# Patient Record
Sex: Male | Born: 1950 | Race: White | Hispanic: No | Marital: Married
Health system: Southern US, Community
[De-identification: ages and names within clinical notes are randomized; demographics above are authoritative.]

---

## 2007-04-08 ENCOUNTER — Ambulatory Visit: Payer: Self-pay | Admitting: Gastroenterology

## 2007-04-28 ENCOUNTER — Ambulatory Visit: Payer: Self-pay | Admitting: General Surgery

## 2008-07-12 ENCOUNTER — Ambulatory Visit: Payer: Self-pay | Admitting: Gastroenterology

## 2010-06-06 ENCOUNTER — Ambulatory Visit: Payer: Self-pay | Admitting: Gastroenterology

## 2012-08-22 ENCOUNTER — Ambulatory Visit: Payer: Self-pay | Admitting: Gastroenterology

## 2013-09-26 ENCOUNTER — Ambulatory Visit: Payer: Self-pay | Admitting: Internal Medicine

## 2013-10-02 ENCOUNTER — Ambulatory Visit: Payer: Self-pay | Admitting: Gastroenterology

## 2013-10-04 ENCOUNTER — Other Ambulatory Visit: Payer: Self-pay | Admitting: Gastroenterology

## 2013-10-04 ENCOUNTER — Ambulatory Visit: Payer: Self-pay | Admitting: Gastroenterology

## 2013-10-04 LAB — CLOSTRIDIUM DIFFICILE(ARMC)

## 2013-10-04 LAB — PLATELET COUNT: Platelet: 254 10*3/uL (ref 150–440)

## 2013-10-04 LAB — PROTIME-INR
INR: 1.1
PROTHROMBIN TIME: 14.1 s (ref 11.5–14.7)

## 2013-10-18 ENCOUNTER — Inpatient Hospital Stay: Payer: Self-pay | Admitting: Internal Medicine

## 2013-10-18 LAB — URINALYSIS, COMPLETE
BILIRUBIN, UR: NEGATIVE
BLOOD: NEGATIVE
Glucose,UR: >=500 mg/dL (ref 0–75)
KETONE: NEGATIVE
LEUKOCYTE ESTERASE: NEGATIVE
NITRITE: NEGATIVE
PROTEIN: NEGATIVE
Ph: 5 (ref 4.5–8.0)
Specific Gravity: 1.018 (ref 1.003–1.030)
Squamous Epithelial: NONE SEEN
WBC UR: 2 /HPF (ref 0–5)

## 2013-10-18 LAB — CBC
HCT: 43.7 % (ref 40.0–52.0)
HGB: 14.7 g/dL (ref 13.0–18.0)
MCH: 30.6 pg (ref 26.0–34.0)
MCHC: 33.7 g/dL (ref 32.0–36.0)
MCV: 91 fL (ref 80–100)
Platelet: 201 10*3/uL (ref 150–440)
RBC: 4.81 10*6/uL (ref 4.40–5.90)
RDW: 15.3 % — AB (ref 11.5–14.5)
WBC: 14.5 10*3/uL — AB (ref 3.8–10.6)

## 2013-10-18 LAB — COMPREHENSIVE METABOLIC PANEL
ALK PHOS: 182 U/L — AB
ALT: 118 U/L — AB (ref 12–78)
Albumin: 3.4 g/dL (ref 3.4–5.0)
Anion Gap: 10 (ref 7–16)
BILIRUBIN TOTAL: 6 mg/dL — AB (ref 0.2–1.0)
BUN: 18 mg/dL (ref 7–18)
Calcium, Total: 9.3 mg/dL (ref 8.5–10.1)
Chloride: 106 mmol/L (ref 98–107)
Co2: 21 mmol/L (ref 21–32)
Creatinine: 0.87 mg/dL (ref 0.60–1.30)
EGFR (African American): >60
EGFR (Non-African Amer.): >60
GLUCOSE: 237 mg/dL — AB (ref 65–99)
Osmolality: 283 (ref 275–301)
POTASSIUM: 3.5 mmol/L (ref 3.5–5.1)
SGOT(AST): 114 U/L — ABNORMAL HIGH (ref 15–37)
SODIUM: 137 mmol/L (ref 136–145)
Total Protein: 7.9 g/dL (ref 6.4–8.2)

## 2013-10-18 LAB — LIPASE, BLOOD: LIPASE: 164 U/L (ref 73–393)

## 2013-10-18 LAB — TROPONIN I: Troponin-I: <0.02 ng/mL

## 2013-10-19 LAB — COMPREHENSIVE METABOLIC PANEL
ALT: 95 U/L — AB (ref 12–78)
Albumin: 2.6 g/dL — ABNORMAL LOW (ref 3.4–5.0)
Alkaline Phosphatase: 108 U/L
Anion Gap: 6 — ABNORMAL LOW (ref 7–16)
BUN: 14 mg/dL (ref 7–18)
Bilirubin,Total: 7.2 mg/dL — ABNORMAL HIGH (ref 0.2–1.0)
CO2: 26 mmol/L (ref 21–32)
CREATININE: 1.01 mg/dL (ref 0.60–1.30)
Calcium, Total: 8 mg/dL — ABNORMAL LOW (ref 8.5–10.1)
Chloride: 104 mmol/L (ref 98–107)
EGFR (African American): >60
Glucose: 181 mg/dL — ABNORMAL HIGH (ref 65–99)
Osmolality: 277 (ref 275–301)
Potassium: 3.7 mmol/L (ref 3.5–5.1)
SGOT(AST): 87 U/L — ABNORMAL HIGH (ref 15–37)
Sodium: 136 mmol/L (ref 136–145)
Total Protein: 6.2 g/dL — ABNORMAL LOW (ref 6.4–8.2)

## 2013-10-19 LAB — CBC WITH DIFFERENTIAL/PLATELET
BASOS ABS: 0 10*3/uL (ref 0.0–0.1)
BASOS PCT: 0.3 %
Eosinophil #: 0 10*3/uL (ref 0.0–0.7)
Eosinophil %: 0.1 %
HCT: 36 % — ABNORMAL LOW (ref 40.0–52.0)
HGB: 12.3 g/dL — AB (ref 13.0–18.0)
LYMPHS ABS: 1.1 10*3/uL (ref 1.0–3.6)
LYMPHS PCT: 8.6 %
MCH: 31 pg (ref 26.0–34.0)
MCHC: 34.1 g/dL (ref 32.0–36.0)
MCV: 91 fL (ref 80–100)
Monocyte #: 1.1 x10 3/mm — ABNORMAL HIGH (ref 0.2–1.0)
Monocyte %: 8.8 %
NEUTROS PCT: 82.2 %
Neutrophil #: 10.2 10*3/uL — ABNORMAL HIGH (ref 1.4–6.5)
PLATELETS: 116 10*3/uL — AB (ref 150–440)
RBC: 3.96 10*6/uL — ABNORMAL LOW (ref 4.40–5.90)
RDW: 15.1 % — ABNORMAL HIGH (ref 11.5–14.5)
WBC: 12.3 10*3/uL — ABNORMAL HIGH (ref 3.8–10.6)

## 2013-10-20 LAB — COMPREHENSIVE METABOLIC PANEL
ALBUMIN: 2.5 g/dL — AB (ref 3.4–5.0)
ALT: 77 U/L (ref 12–78)
ANION GAP: 3 — AB (ref 7–16)
Alkaline Phosphatase: 111 U/L
BILIRUBIN TOTAL: 8.4 mg/dL — AB (ref 0.2–1.0)
BUN: 12 mg/dL (ref 7–18)
Calcium, Total: 8.8 mg/dL (ref 8.5–10.1)
Chloride: 108 mmol/L — ABNORMAL HIGH (ref 98–107)
Co2: 27 mmol/L (ref 21–32)
Creatinine: 0.94 mg/dL (ref 0.60–1.30)
EGFR (African American): >60
EGFR (Non-African Amer.): >60
Glucose: 149 mg/dL — ABNORMAL HIGH (ref 65–99)
Osmolality: 278 (ref 275–301)
Potassium: 4.3 mmol/L (ref 3.5–5.1)
SGOT(AST): 61 U/L — ABNORMAL HIGH (ref 15–37)
Sodium: 138 mmol/L (ref 136–145)
TOTAL PROTEIN: 6.3 g/dL — AB (ref 6.4–8.2)

## 2013-10-20 LAB — CBC WITH DIFFERENTIAL/PLATELET
BASOS ABS: 0 10*3/uL (ref 0.0–0.1)
BASOS PCT: 0.7 %
Eosinophil #: 0.1 10*3/uL (ref 0.0–0.7)
Eosinophil %: 1.5 %
HCT: 36.6 % — AB (ref 40.0–52.0)
HGB: 12.1 g/dL — AB (ref 13.0–18.0)
Lymphocyte #: 0.8 10*3/uL — ABNORMAL LOW (ref 1.0–3.6)
Lymphocyte %: 14.3 %
MCH: 30.4 pg (ref 26.0–34.0)
MCHC: 33.2 g/dL (ref 32.0–36.0)
MCV: 92 fL (ref 80–100)
MONO ABS: 0.4 x10 3/mm (ref 0.2–1.0)
Monocyte %: 6.4 %
Neutrophil #: 4.4 10*3/uL (ref 1.4–6.5)
Neutrophil %: 77.1 %
Platelet: 89 10*3/uL — ABNORMAL LOW (ref 150–440)
RBC: 4 10*6/uL — AB (ref 4.40–5.90)
RDW: 15.5 % — ABNORMAL HIGH (ref 11.5–14.5)
WBC: 5.7 10*3/uL (ref 3.8–10.6)

## 2013-10-20 LAB — URINE CULTURE

## 2013-10-21 LAB — COMPREHENSIVE METABOLIC PANEL
ALBUMIN: 2.5 g/dL — AB (ref 3.4–5.0)
ALK PHOS: 116 U/L
ALT: 66 U/L (ref 12–78)
AST: 51 U/L — AB (ref 15–37)
Anion Gap: 3 — ABNORMAL LOW (ref 7–16)
BUN: 11 mg/dL (ref 7–18)
Bilirubin,Total: 7 mg/dL — ABNORMAL HIGH (ref 0.2–1.0)
CHLORIDE: 108 mmol/L — AB (ref 98–107)
Calcium, Total: 8.4 mg/dL — ABNORMAL LOW (ref 8.5–10.1)
Co2: 25 mmol/L (ref 21–32)
Creatinine: 0.93 mg/dL (ref 0.60–1.30)
EGFR (Non-African Amer.): >60
GLUCOSE: 116 mg/dL — AB (ref 65–99)
OSMOLALITY: 272 (ref 275–301)
Potassium: 4.2 mmol/L (ref 3.5–5.1)
SODIUM: 136 mmol/L (ref 136–145)
Total Protein: 6.5 g/dL (ref 6.4–8.2)

## 2013-10-21 LAB — CBC WITH DIFFERENTIAL/PLATELET
BASOS ABS: 0 10*3/uL (ref 0.0–0.1)
Basophil %: 0.8 %
EOS ABS: 0.1 10*3/uL (ref 0.0–0.7)
Eosinophil %: 2 %
HCT: 35.6 % — ABNORMAL LOW (ref 40.0–52.0)
HGB: 12.1 g/dL — AB (ref 13.0–18.0)
Lymphocyte #: 1 10*3/uL (ref 1.0–3.6)
Lymphocyte %: 20.1 %
MCH: 30.7 pg (ref 26.0–34.0)
MCHC: 33.9 g/dL (ref 32.0–36.0)
MCV: 91 fL (ref 80–100)
Monocyte #: 0.4 x10 3/mm (ref 0.2–1.0)
Monocyte %: 8.9 %
Neutrophil #: 3.3 10*3/uL (ref 1.4–6.5)
Neutrophil %: 68.2 %
PLATELETS: 95 10*3/uL — AB (ref 150–440)
RBC: 3.93 10*6/uL — AB (ref 4.40–5.90)
RDW: 15.3 % — AB (ref 11.5–14.5)
WBC: 4.8 10*3/uL (ref 3.8–10.6)

## 2013-10-22 LAB — CBC WITH DIFFERENTIAL/PLATELET
Basophil #: 0 10*3/uL (ref 0.0–0.1)
Basophil %: 0.9 %
EOS ABS: 0.1 10*3/uL (ref 0.0–0.7)
Eosinophil %: 2.4 %
HCT: 37.8 % — AB (ref 40.0–52.0)
HGB: 12.9 g/dL — AB (ref 13.0–18.0)
LYMPHS ABS: 1.2 10*3/uL (ref 1.0–3.6)
Lymphocyte %: 22 %
MCH: 31 pg (ref 26.0–34.0)
MCHC: 34.2 g/dL (ref 32.0–36.0)
MCV: 91 fL (ref 80–100)
Monocyte #: 0.6 x10 3/mm (ref 0.2–1.0)
Monocyte %: 9.9 %
NEUTROS ABS: 3.6 10*3/uL (ref 1.4–6.5)
Neutrophil %: 64.8 %
Platelet: 114 10*3/uL — ABNORMAL LOW (ref 150–440)
RBC: 4.17 10*6/uL — ABNORMAL LOW (ref 4.40–5.90)
RDW: 15 % — AB (ref 11.5–14.5)
WBC: 5.6 10*3/uL (ref 3.8–10.6)

## 2013-10-22 LAB — COMPREHENSIVE METABOLIC PANEL
ALK PHOS: 131 U/L — AB
Albumin: 2.5 g/dL — ABNORMAL LOW (ref 3.4–5.0)
Anion Gap: 8 (ref 7–16)
BUN: 10 mg/dL (ref 7–18)
Bilirubin,Total: 7.1 mg/dL — ABNORMAL HIGH (ref 0.2–1.0)
Calcium, Total: 8.7 mg/dL (ref 8.5–10.1)
Chloride: 103 mmol/L (ref 98–107)
Co2: 25 mmol/L (ref 21–32)
Creatinine: 0.53 mg/dL — ABNORMAL LOW (ref 0.60–1.30)
EGFR (African American): >60
Glucose: 101 mg/dL — ABNORMAL HIGH (ref 65–99)
Osmolality: 271 (ref 275–301)
Potassium: 3.8 mmol/L (ref 3.5–5.1)
SGOT(AST): 44 U/L — ABNORMAL HIGH (ref 15–37)
SGPT (ALT): 57 U/L (ref 12–78)
Sodium: 136 mmol/L (ref 136–145)
TOTAL PROTEIN: 6.5 g/dL (ref 6.4–8.2)

## 2013-10-23 LAB — CBC WITH DIFFERENTIAL/PLATELET
BASOS PCT: 0.9 %
Basophil #: 0.1 10*3/uL (ref 0.0–0.1)
Eosinophil #: 0.1 10*3/uL (ref 0.0–0.7)
Eosinophil %: 2 %
HCT: 38.7 % — ABNORMAL LOW (ref 40.0–52.0)
HGB: 13.2 g/dL (ref 13.0–18.0)
Lymphocyte #: 1.4 10*3/uL (ref 1.0–3.6)
Lymphocyte %: 18.8 %
MCH: 31.1 pg (ref 26.0–34.0)
MCHC: 34.2 g/dL (ref 32.0–36.0)
MCV: 91 fL (ref 80–100)
MONO ABS: 0.8 x10 3/mm (ref 0.2–1.0)
Monocyte %: 10.8 %
Neutrophil #: 5 10*3/uL (ref 1.4–6.5)
Neutrophil %: 67.5 %
Platelet: 128 10*3/uL — ABNORMAL LOW (ref 150–440)
RBC: 4.26 10*6/uL — ABNORMAL LOW (ref 4.40–5.90)
RDW: 14.8 % — ABNORMAL HIGH (ref 11.5–14.5)
WBC: 7.3 10*3/uL (ref 3.8–10.6)

## 2013-10-23 LAB — COMPREHENSIVE METABOLIC PANEL
ALBUMIN: 2.6 g/dL — AB (ref 3.4–5.0)
ALT: 50 U/L (ref 12–78)
Alkaline Phosphatase: 137 U/L — ABNORMAL HIGH
Anion Gap: 2 — ABNORMAL LOW (ref 7–16)
BILIRUBIN TOTAL: 4.8 mg/dL — AB (ref 0.2–1.0)
BUN: 12 mg/dL (ref 7–18)
CO2: 27 mmol/L (ref 21–32)
CREATININE: 0.83 mg/dL (ref 0.60–1.30)
Calcium, Total: 8.8 mg/dL (ref 8.5–10.1)
Chloride: 104 mmol/L (ref 98–107)
EGFR (Non-African Amer.): >60
GLUCOSE: 129 mg/dL — AB (ref 65–99)
Osmolality: 268 (ref 275–301)
Potassium: 4.2 mmol/L (ref 3.5–5.1)
SGOT(AST): 40 U/L — ABNORMAL HIGH (ref 15–37)
Sodium: 133 mmol/L — ABNORMAL LOW (ref 136–145)
Total Protein: 6.9 g/dL (ref 6.4–8.2)

## 2013-10-24 LAB — CBC WITH DIFFERENTIAL/PLATELET
Basophil #: 0.1 10*3/uL (ref 0.0–0.1)
Basophil %: 0.7 %
EOS ABS: 0.2 10*3/uL (ref 0.0–0.7)
Eosinophil %: 2.7 %
HCT: 41.8 % (ref 40.0–52.0)
HGB: 14.2 g/dL (ref 13.0–18.0)
Lymphocyte #: 1.5 10*3/uL (ref 1.0–3.6)
Lymphocyte %: 18.1 %
MCH: 31.1 pg (ref 26.0–34.0)
MCHC: 33.9 g/dL (ref 32.0–36.0)
MCV: 92 fL (ref 80–100)
Monocyte #: 0.8 x10 3/mm (ref 0.2–1.0)
Monocyte %: 9.9 %
Neutrophil #: 5.6 10*3/uL (ref 1.4–6.5)
Neutrophil %: 68.6 %
PLATELETS: 137 10*3/uL — AB (ref 150–440)
RBC: 4.55 10*6/uL (ref 4.40–5.90)
RDW: 15.4 % — ABNORMAL HIGH (ref 11.5–14.5)
WBC: 8.1 10*3/uL (ref 3.8–10.6)

## 2013-10-24 LAB — COMPREHENSIVE METABOLIC PANEL
ALK PHOS: 136 U/L — AB
ALT: 44 U/L (ref 12–78)
AST: 38 U/L — AB (ref 15–37)
Albumin: 2.7 g/dL — ABNORMAL LOW (ref 3.4–5.0)
Anion Gap: 5 — ABNORMAL LOW (ref 7–16)
BUN: 13 mg/dL (ref 7–18)
Bilirubin,Total: 4.4 mg/dL — ABNORMAL HIGH (ref 0.2–1.0)
CALCIUM: 9 mg/dL (ref 8.5–10.1)
CO2: 28 mmol/L (ref 21–32)
Chloride: 101 mmol/L (ref 98–107)
Creatinine: 0.92 mg/dL (ref 0.60–1.30)
EGFR (African American): >60
Glucose: 134 mg/dL — ABNORMAL HIGH (ref 65–99)
Osmolality: 270 (ref 275–301)
Potassium: 4.1 mmol/L (ref 3.5–5.1)
Sodium: 134 mmol/L — ABNORMAL LOW (ref 136–145)
Total Protein: 7.2 g/dL (ref 6.4–8.2)

## 2013-10-24 LAB — CULTURE, BLOOD (SINGLE)

## 2013-10-26 LAB — CULTURE, BLOOD (SINGLE)

## 2014-01-23 ENCOUNTER — Ambulatory Visit: Payer: Self-pay | Admitting: Internal Medicine

## 2014-01-25 ENCOUNTER — Inpatient Hospital Stay: Payer: Self-pay | Admitting: Internal Medicine

## 2014-01-25 LAB — URINALYSIS, COMPLETE
Bacteria: NONE SEEN
Bilirubin,UR: NEGATIVE
Blood: NEGATIVE
Glucose,UR: 50 mg/dL (ref 0–75)
KETONE: NEGATIVE
LEUKOCYTE ESTERASE: NEGATIVE
NITRITE: NEGATIVE
PROTEIN: NEGATIVE
Ph: 6 (ref 4.5–8.0)
SPECIFIC GRAVITY: 1.005 (ref 1.003–1.030)
Squamous Epithelial: NONE SEEN

## 2014-01-25 LAB — COMPREHENSIVE METABOLIC PANEL
ALBUMIN: 2.8 g/dL — AB (ref 3.4–5.0)
ALK PHOS: 70 U/L
ANION GAP: 8 (ref 7–16)
BILIRUBIN TOTAL: 2.1 mg/dL — AB (ref 0.2–1.0)
BUN: 13 mg/dL (ref 7–18)
CHLORIDE: 104 mmol/L (ref 98–107)
Calcium, Total: 8.3 mg/dL — ABNORMAL LOW (ref 8.5–10.1)
Co2: 20 mmol/L — ABNORMAL LOW (ref 21–32)
Creatinine: 0.91 mg/dL (ref 0.60–1.30)
EGFR (African American): >60
EGFR (Non-African Amer.): >60
Glucose: 223 mg/dL — ABNORMAL HIGH (ref 65–99)
Osmolality: 272 (ref 275–301)
Potassium: 3.8 mmol/L (ref 3.5–5.1)
SGOT(AST): 67 U/L — ABNORMAL HIGH (ref 15–37)
SGPT (ALT): 41 U/L
Sodium: 132 mmol/L — ABNORMAL LOW (ref 136–145)
TOTAL PROTEIN: 7.3 g/dL (ref 6.4–8.2)

## 2014-01-25 LAB — CBC WITH DIFFERENTIAL/PLATELET
BASOS ABS: 0.1 10*3/uL (ref 0.0–0.1)
BASOS PCT: 1 %
Eosinophil #: 0 10*3/uL (ref 0.0–0.7)
Eosinophil %: 0.3 %
HCT: 36.2 % — ABNORMAL LOW (ref 40.0–52.0)
HGB: 12.5 g/dL — ABNORMAL LOW (ref 13.0–18.0)
Lymphocyte #: 0.3 10*3/uL — ABNORMAL LOW (ref 1.0–3.6)
Lymphocyte %: 6 %
MCH: 29.5 pg (ref 26.0–34.0)
MCHC: 34.5 g/dL (ref 32.0–36.0)
MCV: 86 fL (ref 80–100)
MONOS PCT: 2.5 %
Monocyte #: 0.1 x10 3/mm — ABNORMAL LOW (ref 0.2–1.0)
Neutrophil #: 5.1 10*3/uL (ref 1.4–6.5)
Neutrophil %: 90.2 %
Platelet: 101 10*3/uL — ABNORMAL LOW (ref 150–440)
RBC: 4.23 10*6/uL — ABNORMAL LOW (ref 4.40–5.90)
RDW: 13.9 % (ref 11.5–14.5)
WBC: 5.6 10*3/uL (ref 3.8–10.6)

## 2014-01-25 LAB — TROPONIN I

## 2014-01-25 LAB — MAGNESIUM: Magnesium: 1.8 mg/dL

## 2014-01-25 LAB — PROTIME-INR
INR: 1.2
Prothrombin Time: 15.2 secs — ABNORMAL HIGH (ref 11.5–14.7)

## 2014-01-25 LAB — PHOSPHORUS: PHOSPHORUS: 1.8 mg/dL — AB (ref 2.5–4.9)

## 2014-01-26 LAB — HEPATIC FUNCTION PANEL A (ARMC)
AST: 60 U/L — AB (ref 15–37)
Albumin: 2.4 g/dL — ABNORMAL LOW (ref 3.4–5.0)
Alkaline Phosphatase: 55 U/L
Bilirubin, Direct: 0.5 mg/dL — ABNORMAL HIGH (ref 0.00–0.20)
Bilirubin,Total: 2.4 mg/dL — ABNORMAL HIGH (ref 0.2–1.0)
SGPT (ALT): 36 U/L
TOTAL PROTEIN: 6.1 g/dL — AB (ref 6.4–8.2)

## 2014-01-26 LAB — CBC WITH DIFFERENTIAL/PLATELET
BASOS ABS: 0 10*3/uL (ref 0.0–0.1)
Basophil %: 0.6 %
Eosinophil #: 0 10*3/uL (ref 0.0–0.7)
Eosinophil %: 0.8 %
HCT: 31.1 % — AB (ref 40.0–52.0)
HGB: 10.5 g/dL — ABNORMAL LOW (ref 13.0–18.0)
Lymphocyte #: 0.6 10*3/uL — ABNORMAL LOW (ref 1.0–3.6)
Lymphocyte %: 14.8 %
MCH: 29.5 pg (ref 26.0–34.0)
MCHC: 33.8 g/dL (ref 32.0–36.0)
MCV: 87 fL (ref 80–100)
MONOS PCT: 3.6 %
Monocyte #: 0.1 x10 3/mm — ABNORMAL LOW (ref 0.2–1.0)
Neutrophil #: 3.2 10*3/uL (ref 1.4–6.5)
Neutrophil %: 80.2 %
Platelet: 77 10*3/uL — ABNORMAL LOW (ref 150–440)
RBC: 3.57 10*6/uL — ABNORMAL LOW (ref 4.40–5.90)
RDW: 14.1 % (ref 11.5–14.5)
WBC: 4 10*3/uL (ref 3.8–10.6)

## 2014-01-26 LAB — BASIC METABOLIC PANEL
Anion Gap: 8 (ref 7–16)
BUN: 12 mg/dL (ref 7–18)
CALCIUM: 7.5 mg/dL — AB (ref 8.5–10.1)
CHLORIDE: 110 mmol/L — AB (ref 98–107)
CO2: 22 mmol/L (ref 21–32)
CREATININE: 0.74 mg/dL (ref 0.60–1.30)
Glucose: 115 mg/dL — ABNORMAL HIGH (ref 65–99)
Osmolality: 280 (ref 275–301)
Potassium: 3.7 mmol/L (ref 3.5–5.1)
SODIUM: 140 mmol/L (ref 136–145)

## 2014-01-26 LAB — AMYLASE: Amylase: 21 U/L — ABNORMAL LOW (ref 25–115)

## 2014-01-26 LAB — LIPASE, BLOOD: Lipase: 99 U/L (ref 73–393)

## 2014-01-27 LAB — URINE CULTURE

## 2014-01-27 LAB — CBC WITH DIFFERENTIAL/PLATELET
Basophil #: 0 10*3/uL (ref 0.0–0.1)
Basophil %: 0.9 %
EOS ABS: 0.1 10*3/uL (ref 0.0–0.7)
Eosinophil %: 2.6 %
HCT: 31.9 % — AB (ref 40.0–52.0)
HGB: 11 g/dL — ABNORMAL LOW (ref 13.0–18.0)
Lymphocyte #: 0.6 10*3/uL — ABNORMAL LOW (ref 1.0–3.6)
Lymphocyte %: 19 %
MCH: 29.7 pg (ref 26.0–34.0)
MCHC: 34.5 g/dL (ref 32.0–36.0)
MCV: 86 fL (ref 80–100)
MONOS PCT: 2 %
Monocyte #: 0.1 x10 3/mm — ABNORMAL LOW (ref 0.2–1.0)
Neutrophil #: 2.3 10*3/uL (ref 1.4–6.5)
Neutrophil %: 75.5 %
Platelet: 90 10*3/uL — ABNORMAL LOW (ref 150–440)
RBC: 3.69 10*6/uL — AB (ref 4.40–5.90)
RDW: 13.8 % (ref 11.5–14.5)
WBC: 3 10*3/uL — ABNORMAL LOW (ref 3.8–10.6)

## 2014-01-27 LAB — COMPREHENSIVE METABOLIC PANEL
ALK PHOS: 57 U/L
ALT: 38 U/L
Albumin: 2.5 g/dL — ABNORMAL LOW (ref 3.4–5.0)
Anion Gap: 8 (ref 7–16)
BILIRUBIN TOTAL: 1.7 mg/dL — AB (ref 0.2–1.0)
BUN: 8 mg/dL (ref 7–18)
CHLORIDE: 106 mmol/L (ref 98–107)
CO2: 21 mmol/L (ref 21–32)
Calcium, Total: 8.3 mg/dL — ABNORMAL LOW (ref 8.5–10.1)
Creatinine: 0.77 mg/dL (ref 0.60–1.30)
Glucose: 118 mg/dL — ABNORMAL HIGH (ref 65–99)
OSMOLALITY: 270 (ref 275–301)
POTASSIUM: 3.3 mmol/L — AB (ref 3.5–5.1)
SGOT(AST): 53 U/L — ABNORMAL HIGH (ref 15–37)
SODIUM: 135 mmol/L — AB (ref 136–145)
Total Protein: 6.3 g/dL — ABNORMAL LOW (ref 6.4–8.2)

## 2014-01-30 LAB — CULTURE, BLOOD (SINGLE)

## 2014-02-22 ENCOUNTER — Ambulatory Visit: Payer: Self-pay | Admitting: Internal Medicine

## 2014-09-15 NOTE — Consult Note (Signed)
Brief Consult Note: Diagnosis: abd pain, NV.   Patient was seen by consultant.   Consult note dictated.   Comments: Addendum: did discuss patient with Dr Bluford Kaufmannh, Possibility for stent to have slipped a litte or become occluded. Will stop heparin for how. Last dose 81mg  asa yesterday. Continue npo status now. will try for ercp later today. Continue abx as ordered. Discussed with patient and he is aggreeable.  Electronic Signatures: Vevelyn PatLondon, Taris Galindo H (NP)  (Signed 28-May-15 09:14)  Authored: Brief Consult Note   Last Updated: 28-May-15 09:14 by Keturah BarreLondon, Tracia Lacomb H (NP)

## 2014-09-15 NOTE — Consult Note (Signed)
PATIENT NAME:  Roy [name], Maliki D MR#:  [id number] DATE OF BIRTH:  April 08, 1951  DATE OF CONSULTATION:  10/19/2013  REFERRING PHYSICIAN:   CONSULTING PHYSICIAN:  Adah Salvageichard E. Excell Seltzerooper, MD  CHIEF COMPLAINT: Jaundice.   HISTORY OF PRESENT ILLNESS: This is a 64 year old male patient with a history of a biliary stricture. He has had a stent placed in the past by Dr. Bluford Kaufmannh and he describes abdominal pain in the epigastrium radiating through to his back with fevers, but no nausea or vomiting. He was admitted to the hospital and I was asked to see the patient for possible gallbladder disease. Of note, the patient has never had right upper quadrant pain until this episode and, in fact, his pain is epigastric and right upper quadrant and this process has been repeated over the last several weeks. 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   PAST MEDICAL HISTORY: Biliary stricture, portal hypertension and jaundice.   PAST SURGICAL HISTORY: Foot surgery and ERCP with stent.   MEDICATIONS: None.   ALLERGIES: None.   FAMILY HISTORY: Noncontributory.   SOCIAL HISTORY: The patient is a nonsmoker and nondrinker.   REVIEW OF SYSTEMS: A 10 system review is performed and negative with the exception of that mentioned in the HPI.   PHYSICAL EXAMINATION:  GENERAL: Jaundice-appearing patient, otherwise healthy, but appears jaundiced.  VITAL SIGNS: Temperature is 97.9, it was 99.9, pulse 77, respirations 18, blood pressure 105/72, 97% room air sat.  HEENT: Obvious scleral icterus.  NECK: No palpable neck nodes.  CHEST: Clear to auscultation.  CARDIAC: Regular rate and rhythm.  ABDOMEN: Soft and nontender.  EXTREMITIES: Without edema.  NEUROLOGIC: Grossly intact.  INTEGUMENT: Obvious jaundice.   LABORATORY VALUES: On the 28th demonstrated a bilirubin of 7.2, a calcium of 8.0, an albumin  of 2.6, AST and ALT elevated at 87 and 95 with a normal alkaline phosphatase. White blood cell count is 12.3, hemoglobin and hematocrit of 12 and 36 and a platelet count of 116. CT scan is personally reviewed. It demonstrates dilated blood vessels in the portal area. Stent is in place and, what is described as, a cavernous transformation of the porta hepatis consistent with portal hypertension and splenomegaly, but the gallbladder appears normal. Prior studies have been reviewed as well confirming that this is a fairly chronic condition.   ASSESSMENT AND PLAN: This is a patient with jaundice and a biliary stricture of unclear etiology. 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 Dr. Bluford Kaufmannh is working the patient up concerning his stricture, but without signs of clear-cut bile duct stones or gallstone disease, this has to be presumed to be malignant until proven otherwise. Dr. Bluford Kaufmannh is planning an ERCP and those results will be reviewed. This patient may benefit from the review of his condition and history by a liver specialist or transplant team to consider at least biliary bypass. This will be discussed with his treating physicians.  ____________________________ Adah Salvageichard E. Excell Seltzerooper, MD rec:aw D: 10/20/2013 07:39:46 ET T: 10/20/2013 07:53:40 ET JOB#: [id number]  cc: Adah Salvageichard E. Excell Seltzerooper, MD, <Dictator> Lattie HawICHARD E Joyceline Maiorino MD ELECTRONICALLY SIGNED 10/20/2013 13:13

## 2014-09-15 NOTE — Consult Note (Signed)
Brief Consult Note: Diagnosis: sirs.   Patient was seen by consultant.   Consult note dictated.   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 Was seen by me in clinic the day proceeding & feeling well: labs  that day demonstrated normal cbc, improving liver panel, and unremarkable metb other than some hyperglycemia but pt not fasting at time of lab draw.  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 Drank sprite, went in house, had some milk &biscuit  about 10. Started to develop some mild ruq pain and chills, so  went and laid down, about 6428m later, developed significant ruq pain, NV. Reports he called the office and was directed to ED, where he was found to have elevated wbc, fever, tachycarida. Liver panel demonstrated some increase to bilirubin/AST/ALT/ALP. Kidneys had normal function. BC #1 demonstrating gram-rods in anaerobic bottle only, bc #2 with gram- rods in aerobic bottle only.  Abd xray without free air/ abnormality. CT yesterday demonstrated plcmt of stent in CBD, but no > in duct dil from prior exam 3w ago The findings of cavernous transformation are chronic: [facility] this some years ago w/o specific diagnosis. Recent dopplers of hepatic, splenic, portal veins did not demonstrate any Budd Chiari/ other acute alt. to bf. Currently has been receiving Zosyn for fever, >wbc, bacteremia.  Sens pending. Feeling better today: denies further pain, nv. Fever improved, wbc down to 12.3, Some increase to bilirubin noted. transaminases and alp better. Impression and plan: RUQ pain, NV: acute onset. According to literature post ercp cholangitis develops 24-72h after procedure, so this is not likely. Other complications  incl cholecysitis and pancreatitis(no indication of this). May be related to other et. Will d/w Dr Bluford Kaufmannh about role of ERCP, other intervent. Cont' present for now, npo..  Electronic Signatures: Keturah BarreLondon, Avaiah Stempel H (NP)  (Signed [phone]-May-15 08:38)  Authored: Brief Consult Note   Last Updated: 28-May-15 08:38 by Keturah BarreLondon, Boysie Bonebrake H (NP)

## 2014-09-15 NOTE — Consult Note (Signed)
Chief Complaint:  Subjective/Chief Complaint Tolerated full liquids last night but vomited when given solids this AM. Some pruritis. T.bili higher today.   VITAL SIGNS/ANCILLARY NOTES: **Vital Signs.:   29-May-15 08:04  Vital Signs Type Q 8hr  Temperature Temperature (F) 98  Celsius 36.6  Temperature Source oral  Pulse Pulse 84  Respirations Respirations 17  Systolic BP Systolic BP 831  Diastolic BP (mmHg) Diastolic BP (mmHg) 88  Mean BP 106  Pulse Ox % Pulse Ox % 97  Pulse Ox Activity Level  At rest  Oxygen Delivery Room Air/ 21 %   Brief Assessment:  GEN well nourished   Cardiac Regular   Respiratory clear BS   Gastrointestinal Normal   Lab Results:  Hepatic:  29-May-15 05:04   Bilirubin, Total  8.4  Alkaline Phosphatase 111 (45-117 NOTE: New Reference Range 04/14/13)  SGPT (ALT) 77  SGOT (AST)  61  Total Protein, Serum  6.3  Albumin, Serum  2.5  Routine Chem:  29-May-15 05:04   Glucose, Serum  149  BUN 12  Creatinine (comp) 0.94  Sodium, Serum 138  Potassium, Serum 4.3  Chloride, Serum  108  CO2, Serum 27  Calcium (Total), Serum 8.8  Osmolality (calc) 278  eGFR (African American) >60  eGFR (Non-African American) >60 (eGFR values <41m/min/1.73 m2 may be an indication of chronic kidney disease (CKD). Calculated eGFR is useful in patients with stable renal function. The eGFR calculation will not be reliable in acutely ill patients when serum creatinine is changing rapidly. It is not useful in  patients on dialysis. The eGFR calculation may not be applicable to patients at the low and high extremes of body sizes, pregnant women, and vegetarians.)  Anion Gap  3  Routine Hem:  29-May-15 05:04   WBC (CBC) 5.7  RBC (CBC)  4.00  Hemoglobin (CBC)  12.1  Hematocrit (CBC)  36.6  Platelet Count (CBC)  89  MCV 92  MCH 30.4  MCHC 33.2  RDW  15.5  Neutrophil % 77.1  Lymphocyte % 14.3  Monocyte % 6.4  Eosinophil % 1.5  Basophil % 0.7  Neutrophil # 4.4   Lymphocyte #  0.8  Monocyte # 0.4  Eosinophil # 0.1  Basophil # 0.0 (Result(s) reported on 20 Oct 2013 at 06:10AM.)   Assessment/Plan:  Assessment/Plan:  Assessment Jaundice. Cholangitis. CBD stricture. Sometimes LFT may go up the day after ERCP.   Plan Expect LFT to improve by tomorrow. If patient can tolerate solids, patient can be discharged on oral Abx.Recommend that patient not do any heavy exertion at home. Pt to have EUS with [facility] next month. We are trying to arrange that right now. Will have Dr. SGustavo Lahcheck on patient this weekend if patient requires longer stay. thanks.   Electronic Signatures: OVerdie Shire(MD)  (Signed 29-May-15 11:31)  Authored: Chief Complaint, VITAL SIGNS/ANCILLARY NOTES, Brief Assessment, Lab Results, Assessment/Plan   Last Updated: 29-May-15 11:31 by OVerdie Shire(MD)

## 2014-09-15 NOTE — H&P (Signed)
PATIENT NAME:  Roy [name], Roy [name] MR#:  [id number] DATE OF BIRTH:  1950/09/07  DATE OF ADMISSION:  01/25/2014  PRIMARY CARE PHYSICIAN: Roy Beecham, MD  REFERRING PHYSICIAN: Daryel November, MD  CHIEF COMPLAINT: Nausea, vomiting, fever and abdominal pain.   HISTORY OF PRESENT ILLNESS: Roy [name] is a 64 year old year-old male who was recently diagnosed with pancreatic cancer in May 2015, has been undergoing chemotherapy at [facility], comes to the Emergency Department with complaints of nausea, vomiting, abdominal pain, fever of 102 degrees Fahrenheit, started at around 2 in the afternoon. The patient received chemotherapy on Tuesday. Started to experience abdominal pain on the right side of the abdomen. Vomiting brownish material. Does not have any and diarrhea. Workup in the Emergency Department has elevated lactic acid of 1.7, has WBC count of 5.6 with a left shift of 90%. The patient received vancomycin and Zosyn in the Emergency Department. The patient has mild elevation of the LFTs; however, improved from the previous admission.   PAST MEDICAL HISTORY:  1.  Diabetes mellitus, recently diagnosed. 2.  Pancreatic cancer.   PAST SURGICAL HISTORY: ERCP with placement.   ALLERGIES: PHENERGAN.   HOME MEDICATIONS:  1.  Zofran 4 mg every 4 hours as needed.  2.  Glipizide 5 mg 2 times a day.  3.  Aspirin 81 mg daily.  4.  Percocet every 6 hours as needed.   SOCIAL HISTORY: Smoked remotely, quit in 37s. Denies drinking alcohol or using illicit drugs. Married, lives with his wife.   FAMILY HISTORY: A grandmother with dementia. Grandmother died of breast cancer. Mother with dementia. Father is healthy.   REVIEW OF SYSTEMS:  CONSTITUTIONAL: Experiencing generalized weakness.  EYES: No change in vision.  EARS, NOSE AND THROAT: No change in hearing.  RESPIRATORY: Has mild cough. No shortness of breath.  CARDIOVASCULAR: No chest pain, palpations.  GASTROINTESTINAL: Has nausea,  vomiting, abdominal pain.  GENITOURINARY: No dysuria or hematuria.  HEMATOLOGIC: No easy bruising or bleeding.  SKIN: No rash or lesions.  MUSCULOSKELETAL: No joint pains and aches.  NEUROLOGIC: No weakness or numbness in any part of the body.   PHYSICAL EXAMINATION:  GENERAL: This is a well-built, well-nourished, age-appropriate male lying down in the bed, not in distress.  VITAL SIGNS: Temperature 98.8, pulse 105, blood pressure 153/78, respiratory rate of 18, oxygen saturation 100% on room air.  HEENT: Head normocephalic, atraumatic. There is no scleral icterus. Conjunctivae normal. Pupils equal and react to light. Mucous membrane dryness. No pharyngeal erythema.  NECK: Supple. No lymphadenopathy. No JVD. No carotid bruit.  CHEST: Has no focal tenderness. Lungs bilaterally clear to auscultation.  HEART: S1, S2 regular. No murmurs are heard.  ABDOMEN: Bowel sounds present. Soft. Has tenderness in the right side of the abdomen, mild guarding. No rebound tenderness. I could not appreciate any hepatosplenomegaly.  EXTREMITIES: No pedal edema. Pulses 2+.  NEUROLOGIC: The patient is alert, oriented to place, person and time. Cranial nerves II-XII intact. Motor 5/5 in upper and lower extremities.   LABORATORY DATA: Lactic acid 1.7. Chest x-ray, one view portable: No acute cardiopulmonary disease.   UA negative for nitrites and leukocyte esterase.   Magnesium 1.8.   CMP: Glucose 223, BUN 13, creatinine of 0.91, bicarbonate of 20, calcium 8.3, bilirubin of 2.1. The rest of all the values are within normal limits.   CBC: WBC of 5.6, hemoglobin 12.5, platelet count of 101, neutrophils of 90%.   ASSESSMENT AND PLAN: Roy [name] is a 64 year old male with  a known history of pancreatic cancer who is undergoing chemotherapy who comes to the Emergency Department with nausea, vomiting, abdominal pain and fever.  1.  Sepsis. The source of the infection is uncertain. It seems to be more of abdominal,  considering the patient's new onset abdominal pain on the right side of the abdomen. Keep the patient on vancomycin and Zosyn, they are covering the broad-spectrum antibiotics. Blood cultures have been obtained.  2.  Pancreatic cancer. The patient is [facility].  3.  Nausea and vomiting. Keep the patient on clear liquids and followup.  4.  Abdominal pain. We will continue to follow up. If the patient develops more nausea and vomiting, we will obtain CT abdomen and pelvis.  5.  Diabetes mellitus. We will hold the glipizide for now considering the patient being on clear liquids. 6.  Keep the patient on deep vein thrombosis prophylaxis with Lovenox.   TIME SPENT: Fifty minutes.    ____________________________ Roy GriffinsPadmaja Peni Rupard, MD pv:TT D: 01/25/2014 22:23:22 ET T: 01/25/2014 23:01:07 ET JOB#: [id number]  cc: Roy GriffinsPadmaja Seferina Brokaw, MD, <Dictator> Roy GriffinsPADMAJA Donyel Nester MD ELECTRONICALLY SIGNED 01/27/2014 20:58

## 2014-09-15 NOTE — Discharge Summary (Signed)
PATIENT NAME:  Roy [name], Roy [name] MR#:  [id number] DATE OF BIRTH:  1950/06/28  DATE OF ADMISSION:  10/18/2013 DATE OF DISCHARGE:  10/24/2013  REASON FOR ADMISSION: Abdominal pain.   HISTORY OF PRESENT ILLNESS: Please see the dictated HPI done by Dr. Burnadette PopLinthavong on 10/18/2013.   PAST MEDICAL HISTORY:  1. Biliary stricture, status post stent placement.  2. Gallstones.   MEDICATIONS ON ADMISSION: Please see admission note.   ALLERGIES: No known drug allergies.   SOCIAL HISTORY, FAMILY HISTORY, REVIEW OF SYSTEMS: Current as per admission note.   PHYSICAL EXAM:  GENERAL: The patient was non ill appearing, in no acute distress.  VITAL SIGNS: Remarkable for temperature of 100.1, heart rate 124.  HEENT: Unremarkable.  NECK: Supple without JVD.  LUNGS: Clear.  CARDIAC: A rapid rate with a regular rhythm. Normal S1 and S2.  ABDOMEN: Soft but tender in the right upper quadrant. No rebound or guarding.  EXTREMITIES: Without edema.  NEUROLOGIC: Grossly nonfocal.   HOSPITAL COURSE: The patient was admitted with abdominal pain and hyperbilirubinemia. He was found to have recurrent obstruction of his biliary system. He was seen in consultation by GI and surgery. Repeat ERCP was performed with replacement of his stent. He was noted to have positive blood cultures consistent with ascending cholangitis and sepsis. He was maintained on IV antibiotics with improvement of his symptoms. His LFTs improved after a new stent was placed. He was switched to oral antibiotics and continued to do well. By 10/24/2013, the patient was stable and ready for discharge.   DISCHARGE DIAGNOSES:  1. Acute cholangitis with sepsis.  2. Abdominal pain.  3. Hyperbilirubinemia.   DISCHARGE MEDICATIONS:  1. Aspirin 81 mg p.o. daily.  2. Norco 5/325 at 1 p.o. q. 6 hours p.r.n. pain.  3. Zofran 4 mg p.o. q. 4 hours p.r.n. nausea.  4. Ceftin 500 mg p.o. b.i.[name]. for 10 days.  5. Protonix 40 mg p.o. b.i.[name].  FOLLOWUP PLANS AND  APPOINTMENTS: The patient was discharged on a low-fat, low-cholesterol diet. He will follow up with myself within 1 week's time, sooner if needed.    ____________________________ Duane LopeJeffrey [name]. Judithann SheenSparks, MD jds:lt [name]: 11/04/2013 17:38:23 ET T: 11/05/2013 03:14:33 ET JOB#: [id number]  cc: Duane LopeJeffrey [name]. Judithann SheenSparks, MD, <Dictator> Skarlet Lyons Rodena Medin Aliyah Abeyta MD ELECTRONICALLY SIGNED 11/05/2013 10:22

## 2014-09-15 NOTE — Consult Note (Signed)
Chief Complaint:  Subjective/Chief Complaint feeling some better today, no abdominal pain or nausea, tolerating po, clinically less jaundiced.   VITAL SIGNS/ANCILLARY NOTES: **Vital Signs.:   31-May-15 09:06  Vital Signs Type Q 8hr  Celsius 36.6  Temperature Source oral  Pulse Pulse 70  Respirations Respirations 18  Systolic BP Systolic BP 378  Diastolic BP (mmHg) Diastolic BP (mmHg) 77  Mean BP 93  Pulse Ox % Pulse Ox % 98  Pulse Ox Activity Level  At rest  Oxygen Delivery Room Air/ 21 %   Brief Assessment:  Cardiac Regular   Respiratory clear BS   Gastrointestinal details normal Soft  Nontender  Nondistended  No masses palpable  Bowel sounds normal   Lab Results: Hepatic:  27-May-15 13:57   Bilirubin, Total  6.0  Alkaline Phosphatase  182 (45-117 NOTE: New Reference Range 04/14/13)  SGOT (AST)  114  28-May-15 05:40   Bilirubin, Total  7.2  Alkaline Phosphatase 108 (45-117 NOTE: New Reference Range 04/14/13)  SGOT (AST)  87  29-May-15 05:04   Bilirubin, Total  8.4  Alkaline Phosphatase 111 (45-117 NOTE: New Reference Range 04/14/13)  SGOT (AST)  61  30-May-15 04:58   Bilirubin, Total  7.0  Alkaline Phosphatase 116 (45-117 NOTE: New Reference Range 04/14/13)  SGOT (AST)  51  31-May-15 05:20   Bilirubin, Total  7.1  Alkaline Phosphatase  131 (45-117 NOTE: New Reference Range 04/14/13)  SGPT (ALT) 57  SGOT (AST)  44  Total Protein, Serum 6.5  Albumin, Serum  2.5  Routine Chem:  31-May-15 05:20   Glucose, Serum  101  BUN 10  Creatinine (comp)  0.53  Sodium, Serum 136  Potassium, Serum 3.8  Chloride, Serum 103  CO2, Serum 25  Calcium (Total), Serum 8.7  Osmolality (calc) 271  eGFR (African American) >60  eGFR (Non-African American) >60 (eGFR values <80m/min/1.73 m2 may be an indication of chronic kidney disease (CKD). Calculated eGFR is useful in patients with stable renal function. The eGFR calculation will not be reliable in acutely ill  patients when serum creatinine is changing rapidly. It is not useful in  patients on dialysis. The eGFR calculation may not be applicable to patients at the low and high extremes of body sizes, pregnant women, and vegetarians.)  Anion Gap 8  Routine Hem:  27-May-15 13:57   Platelet Count (CBC) 201 (Result(s) reported on 18 Oct 2013 at 02:32PM.)  28-May-15 05:40   Platelet Count (CBC)  116  29-May-15 05:04   Platelet Count (CBC)  89  30-May-15 04:58   Platelet Count (CBC)  95  31-May-15 05:20   WBC (CBC) 5.6  RBC (CBC)  4.17  Hemoglobin (CBC)  12.9  Hematocrit (CBC)  37.8  Platelet Count (CBC)  114  MCV 91  MCH 31.0  MCHC 34.2  RDW  15.0  Neutrophil % 64.8  Lymphocyte % 22.0  Monocyte % 9.9  Eosinophil % 2.4  Basophil % 0.9  Neutrophil # 3.6  Lymphocyte # 1.2  Monocyte # 0.6  Eosinophil # 0.1  Basophil # 0.0 (Result(s) reported on 22 Oct 2013 at 06:13AM.)   Assessment/Plan:  Assessment/Plan:  Assessment 1) jaundice, biliary stricture of uncertain etiology, s/p ERCP with stent placement and exchange. stable, slight improvement of labs 2) cholangitis-on abx, improving.   Plan 1) continue current, EUS when clinically feasible as o/p. Dr OCandace Cruiseto return tomorrow.   Electronic Signatures: SLoistine Simas(MD)  (Signed 31-May-15 14:04)  Authored: Chief Complaint, VITAL SIGNS/ANCILLARY NOTES, Brief  Assessment, Lab Results, Assessment/Plan   Last Updated: 31-May-15 14:04 by Loistine Simas (MD)

## 2014-09-15 NOTE — H&P (Signed)
PATIENT NAME:  Roy [name], Roy [name] MR#:  [name] DATE OF BIRTH:  December 25, 1950  DATE OF ADMISSION:  10/18/2013  CHIEF COMPLAINT: Right upper quadrant pain.   HISTORY OF PRESENT ILLNESS: This is a 64 year old male status post history of biliary stent placement with biliary stricture, comes in with right upper quadrant abdominal pain that started around 10:30 this morning with nausea and vomiting, associated fever and elevated white blood cell count. We were contacted by the ER physician for admission. He currently is under pain control status post morphine, not actively vomiting, and his temperature did come down to 100.1 at this time.   PAST MEDICAL HISTORY: History of biliary stricture, status post stent placement per Roy [name].   PAST SURGICAL HISTORY: History of foot surgery.   MEDICATIONS: None.   ALLERGIES: No known drug allergies.   SOCIAL HISTORY: No history of alcohol, drug or tobacco use.  FAMILY HISTORY: Noncontributory.   PHYSICAL EXAMINATION: VITAL SIGNS: Temperature 100.1 with a T-max of 103, pulse rate 124, blood pressure 140/74, respiratory rate 18.  GENERAL: This is a non-ill-appearing white male in no apparent distress, alert and oriented x 3.  HEENT: Extraocular movements are intact. Pupils are equal and reactive to light and accommodation. Moist mucous membranes.  NECK: Supple without lymphadenopathy.  CARDIOVASCULAR: Tachycardic with a regular rhythm. No murmurs, rubs, or gallops.  RESPIRATORY: Clear to auscultation bilaterally. No increased work of breathing. No wheezing or rhonchi.  ABDOMEN: Soft. Minimal tenderness to the right upper quadrant with no rebound or guarding.  SKIN: Normal color. Normal turgor.  NEUROLOGIC: Cranial nerves II through XII grossly intact with no focal deficits.   PERTINENT LABORATORIES AND STUDIES: Upon arrival, sodium 137, potassium 3.5, creatinine 0.87, glucose 237, AST 114, ALT 118, alkaline phosphatase 182, total bilirubin of 6. Troponin is  less than 0.02. White blood cell count 14.5, hemoglobin 14.7, platelets of 201. Lactic acid 3.6. CT of the abdomen and pelvis showed biliary duct dilation that is unchanged from prior exam. Biliary stent in place. Exam consistent with portal hypertension, cirrhosis, and splenomegaly with no signs of ascites. Chest x-ray was negative. Urinalysis negative.   ASSESSMENT AND PLAN: This is a 64 year old male being admitted with right upper quadrant abdominal pain consistent with systemic inflammatory response syndrome, concerning for possible recurrent cholecystitis.  1.  Right upper quadrant pain with systemic inflammatory response syndrome: The patient has both fever, elevated white blood cell count, and tachycardia consistent with systemic inflammatory response syndrome. He is being admitted for aggressive hydration. Also will be placed on n.p.o. with GI and surgical consult for possible recurrent cholecystitis with biliary duct dilatation on CT and stent placement. Will place him on Zosyn for antibiotic coverage. Will obtain blood cultures x 2. Will follow the trend on the CBC and his temperature.  2.  Fluids, electrolytes, nutrition:  Will place the patient n.p.o. He is on dextrose, D5 half-normal saline at 125 an hour. Control his pain with morphine at this time and cover with heparin for prophylaxis.   DISPOSITION: The patient is being admitted to inpatient status with right upper quadrant pain consistent with SIRS. Surgical and GI consult pending at this time. We will transfer care to Roy [name] in the morning.    ____________________________ Roy IvanKanhka Wyatt Thorstenson, MD kl:jcm [name]: 10/18/2013 18:51:09 ET T: 10/18/2013 19:14:06 ET JOB#: [id number]  cc: Roy IvanKanhka Maxmilian Trostel, MD, <Dictator> Roy IvanKANHKA Tyreke Kaeser MD ELECTRONICALLY SIGNED 10/23/2013 8:28

## 2014-09-15 NOTE — Consult Note (Signed)
PATIENT NAME:  Roy OppenheimSLEY, Jeriko D MR#:  [id number] DATE OF BIRTH:  May 20, 1951  DATE OF CONSULTATION:  01/26/2014  REFERRING PHYSICIAN:  Lynnea FerrierBert J. Klein III, MD  CONSULTING PHYSICIAN:  Marrietta Thunder R. Sherrlyn HockPandit, MD  REASON FOR CONSULTATION: Pancreatic cancer on chemotherapy with progressing thrombocytopenia.   HISTORY OF PRESENT ILLNESS: The patient is a 64 year old gentleman with past medical history significant for diabetes mellitus, pancreatic cancer (localized disease. Per patient and family report, [facility] May[facility]June 2015 by ERCP and biopsy and reportedly is on weekly chemotherapy on clinical trial. States that he has completed 8 planned weeks of chemotherapy treatment and is scheduled to go back to [facility] on 09/22 to consider repeat CT scanning and plan a 5 day course of radiation based upon response to treatment). The patient is currently admitted to our hospital with fever up to 102, abdominal pain, nausea and vomiting. Denies any diarrhea, bright red blood in stools or melena. No dysuria or hematuria. States that he feels much better today, wanting to go home. Oral intake is better. He is on broad-spectrum IV antibiotics. CBC showed WBC 4000 with ANC 3200 today with hemoglobin 10.5 and platelets 77,000. The patient has had mild thrombocytopenia in the past including a platelet count of 114,000 to 89,000 in 09/2013.   PAST MEDICAL HISTORY AND PAST SURGICAL HISTORY: As in HPI above. In addition, gives a history of cirrhosis and varices, likely from portal hypertension.   ALLERGIES: Include PHENERGAN.   HOME MEDICATIONS: Aspirin 81 mg daily, glipizide extended release 5 mg b.i.d., ondansetron 4 mg every 4 hours p.r.n. for nausea, Norco 5/325 every 6 hours p.r.n. for pain.   FAMILY HISTORY: Noncontributory.   SOCIAL HISTORY: Currently denies smoking, alcohol or recreational drug usage.   REVIEW OF SYSTEMS: CONSTITUTIONAL: The patient is a 64.  CONSTITUTIONAL: The patient is having  intermittent nausea, but overall better today. Feels stronger today. No fever or chills today.  HEENT: Currently denies headaches or dizziness at rest. No epistaxis, ear or jaw pain.  CARDIAC: No angina, palpitation, orthopnea or PND.  LUNGS: No new dyspnea, cough, chest pain or hemoptysis.  GASTROINTESTINAL: As in HPI.  GENITOURINARY: No dysuria or hematuria.  SKIN: No new rashes or pruritus.  HEMATOLOGIC: No obvious bleeding issues.  MUSCULOSKELETAL: No new bone pains.  EXTREMITIES: No new swelling or pain.  NEUROLOGIC: No new focal weakness, seizures or loss of consciousness.  ENDOCRINE: No polyuria or polydipsia.   PHYSICAL EXAMINATION: GENERAL: The patient is a moderately built and nourished individual, resting in bed, alert and oriented and converses appropriately. Icterus present. No pallor.  VITAL SIGNS: 99.3, 104, 20, 119/71, 97% on room air.  HEENT: Normocephalic, atraumatic. Extraocular movements intact. No oral thrush.  NECK: Negative for lymphadenopathy.  CARDIOVASCULAR: S1, S2. Regular rate and rhythm.  LUNGS: Bilateral good air entry, no crepitations or rhonchi noted.  ABDOMEN: Soft, nontender. No guarding or rigidity. Bowel sounds present.  EXTREMITIES: No major edema or cyanosis.  SKIN: No generalized rashes or major bruising.  MUSCULOSKELETAL: No obvious joint redness or swelling.  NEUROLOGIC: Limited examination. Cranial nerves intact, moves all extremities spontaneously.   LABORATORY DATA: WBC 4000, ANC 3200, hemoglobin 10.5, platelets 77,000, MCV 87. Creatinine 0.74, calcium 7.5, lipase 99. LFT shows bilirubin 2.4. AST 60, albumin 2.4. Otherwise unremarkable. Blood cultures negative so far.   DIAGNOSTIC DATA: Portable chest x-ray done yesterday reported some increase in bibasilar subsegmental atelectasis versus early infiltrates.   IMPRESSION AND RECOMMENDATIONS: A 64 year old gentleman  with a history of reportedly localized pancreatic cancer who has been receiving  treatment at [facility], enrolled on a clinical trial and recently completed 8 weeks of chemotherapy treatment (records are not available at this time), who is currently admitted with gastrointestinal symptoms and fever. The patient is currently on broad-spectrum IV Zosyn and vancomycin coverage and is clinically feeling better. He remains afebrile at this time and is eating better. Chest x-ray was suggestive of bibasilar subsegmental atelectasis versus early infiltrates. He does not have progressive cough or sputum. CBC shows that [facility] are within the normal range despite being on chemotherapy. He does have some progression of thrombocytopenia. It appears that he had mild thrombocytopenia prior to starting treatment and this might have worsened down to 77,000 today.   Since he has been receiving chemotherapy, it could be myelosuppressive effect. The patient does not have any bleeding issues. Recommend continuing to monitor blood counts daily while in hospital and transfuse platelets if it drops to less than 20 or at higher counts if he has major bleeding issues. The patient also had CT abdomen and pelvis with contrast earlier today, which is reporting progressive pancreatic head mass and peripancreatic lymphadenopathy, consistent with tumor progression. The patient and family present at bedside were explained about these findings and I have advised them to discuss with [facility] as soon as he gets discharged from here in case they need to see him and change the treatment plan or other intervention. The patient otherwise does have severe varices and splenomegaly, likely related to his chronic liver disease noted on CT scan, which could explain baseline thrombocytopenia. I do not see the need for further work-up of thrombocytopenia at this time, unless it continues to worsen. We will continue to follow while he is in the hospital. If he is discharged, the patient plans to set up follow up at [facility] right away.    Thank you for the referral. Please feel free to contact me for additional questions.      ____________________________ Maren Reamer Sherrlyn Hock, MD srp:TT D: 01/26/2014 17:30:05 ET T: 01/26/2014 18:12:18 ET JOB#: [id number]  cc: Dannia Snook R. Sherrlyn Hock, MD, <Dictator>  Wille Celeste MD ELECTRONICALLY SIGNED 01/27/2014 13:44

## 2014-09-15 NOTE — Consult Note (Signed)
PATIENT NAME:  Roy [name], Roy [name] MR#:  [id number] DATE OF BIRTH:  January 15, 1951  DATE OF CONSULTATION:  10/19/2013  REFERRING PHYSICIAN:  Dion Body, MD CONSULTING PHYSICIAN:  Theodore Demark, NP  REASON FOR CONSULTATION: Evaluate right upper quadrant pain, nausea and vomiting.   HISTORY OF PRESENT ILLNESS: I appreciate consult for this 64 year old Caucasian man with history of cavernous hemangioma, possible cirrhosis of unknown etiology and ERCP 15 days ago due to onset of jaundice 3 weeks ago with findings of a biliary stricture for evaluation of acute onset of right upper quadrant pain, nausea, and vomiting at 10:30 yesterday morning. He was seen by me in the clinic the day preceding and feeling well. Labs that day demonstrated normal CBC, improving liver panel and unremarkable MET-B, other than some hyperglycemia, but the patient had not been fasting at the time of lab draw. Reports that yesterday morning prior to onset of symptoms he had been out laying irrigation pipe and driving a tractor, drank Sprite, went in the house, had some mild and biscuit about 10:00 a.m., started to develop some mild right upper quadrant pain and chills so went and laid down. About 30 minutes later developed significant right upper quadrant pain, nausea and vomiting. Reports he called the office and was directed to the Emergency Department where he was found to have elevated white count, fever, tachycardia. Liver panel demonstrated some increase to bilirubin, AST, ALT, and ALP. Kidneys had normal function. Blood culture #1 is demonstrating gram-negative rods in anaerobic bottle only. Blood culture #2 with gram-negative rods in aerobic bottle only. Abdominal x-ray without free air or abnormalities. CT yesterday demonstrated placement of a stent in the common bile duct, but no increase in ductal dilation from prior exam 3 weeks ago. The findings of cavernous transformation are chronic. He has had evaluation at [facility] for  this some years ago without specific diagnosis. Recent Dopplers of hepatic, splenic, and portal veins did not demonstrate any Chiari/other acute alterations to the blood flow. Currently has been receiving Zosyn for fever,  increased blood count, bacteremia. Sensitivities are pending. States he is feeling better today. Denies further pain, nausea, vomiting. Fever improved. White blood count down to 12.3. Some increased to bilirubin noted. Transaminases and ALP have improved.   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   MEDICATIONS: Aspirin 81 mg.  ALLERGIES: No known drug allergies.   SOCIAL HISTORY: No alcohol, drugs, or tobacco. Lives at home with family.   FAMILY HISTORY: No family history of GI malignancy or liver disease.   REVIEW OF SYSTEMS: Ten systems reviewed, unremarkable other than what is noted above.   PHYSICAL EXAMINATION: VITAL SIGNS: Most recent vital signs: Temperature 97.9, blood pressure 105/72, pulse 76, SaO2 98%, respiratory rate 18.  GENERAL: Well-appearing Caucasian man lying in bed in no acute distress.  HEENT: Normocephalic, atraumatic. Sclerae with minimal jaundice.  NECK: Supple. No lymphadenopathy, JVD.  CHEST: Respirations eupneic. Lungs clear.  CARDIOVASCULAR: S1 and S2, RRR. No MRG. Cap refill less than 3 seconds. No appreciable edema.  ABDOMEN: Flat, soft, nontender, nondistended. No rebound tenderness, peritoneal signs, hepatosplenomegaly or lymphadenopathy.  SKIN: Warm, dry, slight jaundice.  NEUROLOGIC: Alert and oriented x3. Cranial nerves II through XII intact. Speech clear. No facial droop.   IMPRESSION AND PLAN: Right upper quadrant pain, nausea and vomiting, acute onset. According literature, post ERCP cholangitis develops 24 to 72 hours after procedure so this is not likely. Other complications include  cholecystitis, cholangitis for other reasons and pancreatitis, but no  signs of pancreatitis. May be related to other etiology. I have discussed this with Dr. Candace Cruise. Possibility that the stent may have slipped a little or become occluded. We will stop the heparin for now. Last dose of 81 mg ASA was yesterday. He has not had any today. We will continue his n.p.o. status, no ice chips today, and we will try for ERCP later this afternoon. Continue the antibiotics as you are. Discussed indications, risks, and benefits of the ERCP with the patient and he is agreeable and willing to proceed.   Thank you very much for this consult.   This services were provided by Stephens November, MSN, [facility] in collaboration with Dr. Verdie Shire with whom I have discussed this patient in full. Thank you very much for this consult.   ____________________________ Theodore Demark, NP chl:sb D: 10/19/2013 13:23:13 ET T: 10/19/2013 13:36:09 ET JOB#: [id number]  cc: Theodore Demark, NP, <Dictator> [place] SIGNED 11/01/2013 14:24

## 2014-09-15 NOTE — Consult Note (Signed)
Chief Complaint:  Subjective/Chief Complaint seen for cholangitis, abnormal lfts. some increased nausea this am but less abdominal pain.  no emesis, passing flatus.   VITAL SIGNS/ANCILLARY NOTES: **Vital Signs.:   30-May-15 11:42  Vital Signs Type Q 8hr  Celsius 36.6  Temperature Source oral  Pulse Pulse 79  Respirations Respirations 18  Systolic BP Systolic BP 034  Diastolic BP (mmHg) Diastolic BP (mmHg) 83  Mean BP 100  Pulse Ox % Pulse Ox % 98  Pulse Ox Activity Level  At rest  Oxygen Delivery Room Air/ 21 %   Brief Assessment:  GEN obvious jaundice   Cardiac Regular   Respiratory clear BS   Gastrointestinal details normal Soft  Nondistended  Bowel sounds normal  No rebound tenderness  mild tenderness in the epigastrum/right   Lab Results: Hepatic:  27-May-15 13:57   Bilirubin, Total  6.0  Alkaline Phosphatase  182 (45-117 NOTE: New Reference Range 04/14/13)  SGOT (AST)  114  28-May-15 05:40   Bilirubin, Total  7.2  Alkaline Phosphatase 108 (45-117 NOTE: New Reference Range 04/14/13)  SGOT (AST)  87  29-May-15 05:04   Bilirubin, Total  8.4  Alkaline Phosphatase 111 (45-117 NOTE: New Reference Range 04/14/13)  SGOT (AST)  61  30-May-15 04:58   Bilirubin, Total  7.0  Alkaline Phosphatase 116 (45-117 NOTE: New Reference Range 04/14/13)  SGPT (ALT) 66  SGOT (AST)  51  Total Protein, Serum 6.5  Albumin, Serum  2.5  Routine Micro:  27-May-15 14:02   Organism Name KLEBSIELLA PNEUMONIAE SSP PNEUMONIAE  Cefazolin Sensitivity S  Ampicillin Sensitivity R  Ceftriaxone Sensitivity S  Ciprofloxacin Sensitivity S  Gentamicin Sensitivity S  Ceftazidime Sensitivity S  Imipenem Sensitivity S  Levofloxacin Sensitivity S  Cefoxitin Sensitivity S  Routine Chem:  30-May-15 04:58   Glucose, Serum  116  BUN 11  Creatinine (comp) 0.93  Sodium, Serum 136  Potassium, Serum 4.2  Chloride, Serum  108  CO2, Serum 25  Calcium (Total), Serum  8.4  Osmolality (calc)  272  eGFR (African American) >60  eGFR (Non-African American) >60 (eGFR values <61m/min/1.73 m2 may be an indication of chronic kidney disease (CKD). Calculated eGFR is useful in patients with stable renal function. The eGFR calculation will not be reliable in acutely ill patients when serum creatinine is changing rapidly. It is not useful in  patients on dialysis. The eGFR calculation may not be applicable to patients at the low and high extremes of body sizes, pregnant women, and vegetarians.)  Anion Gap  3  Routine Hem:  27-May-15 13:57   WBC (CBC)  14.5  28-May-15 05:40   WBC (CBC)  12.3  29-May-15 05:04   WBC (CBC) 5.7  30-May-15 04:58   WBC (CBC) 4.8  RBC (CBC)  3.93  Hemoglobin (CBC)  12.1  Hematocrit (CBC)  35.6  Platelet Count (CBC)  95  MCV 91  MCH 30.7  MCHC 33.9  RDW  15.3  Neutrophil % 68.2  Lymphocyte % 20.1  Monocyte % 8.9  Eosinophil % 2.0  Basophil % 0.8  Neutrophil # 3.3  Lymphocyte # 1.0  Monocyte # 0.4  Eosinophil # 0.1  Basophil # 0.0 (Result(s) reported on 21 Oct 2013 at 05:40AM.)   Radiology Results: CT:    27-May-15 15:54, CT Abdomen and Pelvis With Contrast  CT Abdomen and Pelvis With Contrast   REASON FOR EXAM:    (1) diffuse tenderness, RUQ tender/rebound; (2)   diffuse tenderness, RUQ tender/r  COMMENTS:  May transport without cardiac monitor    PROCEDURE: CT  - CT ABDOMEN / PELVIS  W  - Oct 18 2013  3:54PM     CLINICAL DATA:  Abdominal pain. Cirrhosis. Reason biliary stent  placement    EXAM:  CT ABDOMEN AND PELVIS WITH CONTRAST    TECHNIQUE:  Multidetector CT imaging of the abdomen and pelvis was performed  using the standard protocol following bolus administration of  intravenous contrast.    CONTRAST:  100 mL Isovue    COMPARISON:  MRI of 10/02/2013, CT 09/26/2013    FINDINGS:  Lung bases are clear.  No pericardial fluid.    There is mild intrahepatic and extrahepatic biliary duct dilatation.  There is a stent  within the common bile duct which extends to the  duodenum. The degree of biliary duct dilatation similar to  comparison exam. The gallbladder is normal.    There is cavernous transformation with multiple venous collaterals  the portal hepatis not changed from prior. Portal veins are  diminutive but patent. The pancreas is normal without evidence duct  dilatation. The spleen is mildly enlarged similar prior. Splenic  vein is patent.    Adrenal glands and kidneys are normal. The stomach, small bowel  cecum are normal. There is a small lymph node along the posterior  wall of the the cecum measuring 10 mm (image 53, series 2) which is  not changed from prior. Appendix not clearly identified. Colon and  rectosigmoid colon are normal.    Abdominal or is normal caliber. No retroperitoneal periportal  lymphadenopathy.    No free fluid the pelvis. Prostate gland and bladder normal. No  pelvic lymphadenopathy. No aggressive osseous lesion.     IMPRESSION:  1. Interval placement of a biliary stent in the common bile duct.  Mild intrahepatic and extrahepatic biliary duct dilatation similar  to prior.  2. 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  3. Sequelae of cirrhosis noted with splenomegaly and morphologic  changes within the liver.  4. No evidence of ascites.      Electronically Signed    By: Suzy Bouchard M.D.    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   Plan 1) continue current, recheck labs in am.  Will need to arrange for endoscopic [place] and stent exchange in a couple weeks, to be arranged in o/p clinic.  following, thank you for IM assistance.   Electronic Signatures: Loistine Simas  (MD)  (Signed 30-May-15 16:16)  Authored: Chief Complaint, VITAL SIGNS/ANCILLARY NOTES, Brief Assessment, Lab Results, Radiology Results, Assessment/Plan   Last Updated: 30-May-15 16:16 by Loistine Simas (MD)

## 2014-09-15 NOTE — Consult Note (Signed)
Chief Complaint:  Subjective/Chief Complaint Overall feeling better. Min abd pain. No fever/chills. Eating without any problems. Finally, drop in LFT.   VITAL SIGNS/ANCILLARY NOTES: **Vital Signs.:   01-Jun-15 07:40  Vital Signs Type Q 8hr  Temperature Temperature (F) 97.8  Celsius 36.5  Temperature Source oral  Pulse Pulse 74  Respirations Respirations 18  Systolic BP Systolic BP 171  Diastolic BP (mmHg) Diastolic BP (mmHg) 77  Mean BP 95  Pulse Ox % Pulse Ox % 95  Pulse Ox Activity Level  At rest  Oxygen Delivery Room Air/ 21 %   Brief Assessment:  GEN no acute distress   Cardiac Regular   Respiratory clear BS   Gastrointestinal Normal   Lab Results: Hepatic:  01-Jun-15 05:02   Bilirubin, Total  4.8  Alkaline Phosphatase  137 (45-117 NOTE: New Reference Range 04/14/13)  SGPT (ALT) 50  SGOT (AST)  40  Total Protein, Serum 6.9  Albumin, Serum  2.6  Routine Chem:  01-Jun-15 05:02   Glucose, Serum  129  BUN 12  Creatinine (comp) 0.83  Sodium, Serum  133  Potassium, Serum 4.2  Chloride, Serum 104  CO2, Serum 27  Calcium (Total), Serum 8.8  Osmolality (calc) 268  eGFR (African American) >60  eGFR (Non-African American) >60 (eGFR values <28m/min/1.73 m2 may be an indication of chronic kidney disease (CKD). Calculated eGFR is useful in patients with stable renal function. The eGFR calculation will not be reliable in acutely ill patients when serum creatinine is changing rapidly. It is not useful in  patients on dialysis. The eGFR calculation may not be applicable to patients at the low and high extremes of body sizes, pregnant women, and vegetarians.)  Anion Gap  2  Routine Hem:  01-Jun-15 05:02   WBC (CBC) 7.3  RBC (CBC)  4.26  Hemoglobin (CBC) 13.2  Hematocrit (CBC)  38.7  Platelet Count (CBC)  128  MCV 91  MCH 31.1  MCHC 34.2  RDW  14.8  Neutrophil % 67.5  Lymphocyte % 18.8  Monocyte % 10.8  Eosinophil % 2.0  Basophil % 0.9  Neutrophil # 5.0   Lymphocyte # 1.4  Monocyte # 0.8  Eosinophil # 0.1  Basophil # 0.1 (Result(s) reported on 23 Oct 2013 at 05:39AM.)   Assessment/Plan:  Assessment/Plan:  Assessment Cholangitis with jaundice. S/P stenting. LFT improving. ON IV Abx.   Plan Agree with discharge by tomorrow unless LFT suddenly rises. [facility] Call me back if there are issues. thanks.   Electronic Signatures: OVerdie Shire(MD)  (Signed 01-Jun-15 12:39)  Authored: Chief Complaint, VITAL SIGNS/ANCILLARY NOTES, Brief Assessment, Lab Results, Assessment/Plan   Last Updated: 01-Jun-15 12:39 by OVerdie Shire(MD)

## 2014-09-15 NOTE — Consult Note (Signed)
Pt seen and examined. Please see C. London's notes. Pt admitted with jaundice and cholangitis. ERCP showed that biliary stent had migrated distally. Stent removed. Pus drained. Distal CBD stricture still present. New 10 Fr x 5 cm biliary stent planced. Excellent bile drainage. Full liquid diet. Reg diet tomorrow. Expect cholangitis to clear quickly. Continue Abx. thanks.  Electronic Signatures: Lutricia Feilh, Kevionna Heffler (MD)  (Signed on 28-May-15 17:26)  Authored  Last Updated: 28-May-15 17:26 by Lutricia Feilh, Belton Peplinski (MD)

## 2014-09-15 NOTE — Consult Note (Signed)
Brief Consult Note: Diagnosis: cholangitis.   Patient was seen by consultant.   Consult note dictated.   Recommend to proceed with surgery or procedure.   Comments: Doubt that this condition is related to GB as pt has primary live r disease and cholangitis with likey dislodged or occluded stent. Nml GB on mult prior studies with obv signs of Spl vv thrombosis and portal HTN. No surgical plans but will follow as needed.  Electronic Signatures: Lattie Hawooper, Niaya Hickok E (MD)  (Signed 28-May-15 13:27)  Authored: Brief Consult Note   Last Updated: 28-May-15 13:27 by Lattie Hawooper, Jianni Shelden E (MD)

## 2014-12-11 IMAGING — CR DG CHEST 1V PORT
1 series · 1 of 1 positions shown · non-contrast
Comparison: 10/18/2013

CLINICAL DATA: Sepsis

EXAM:
PORTABLE CHEST - 1 VIEW

[ap]
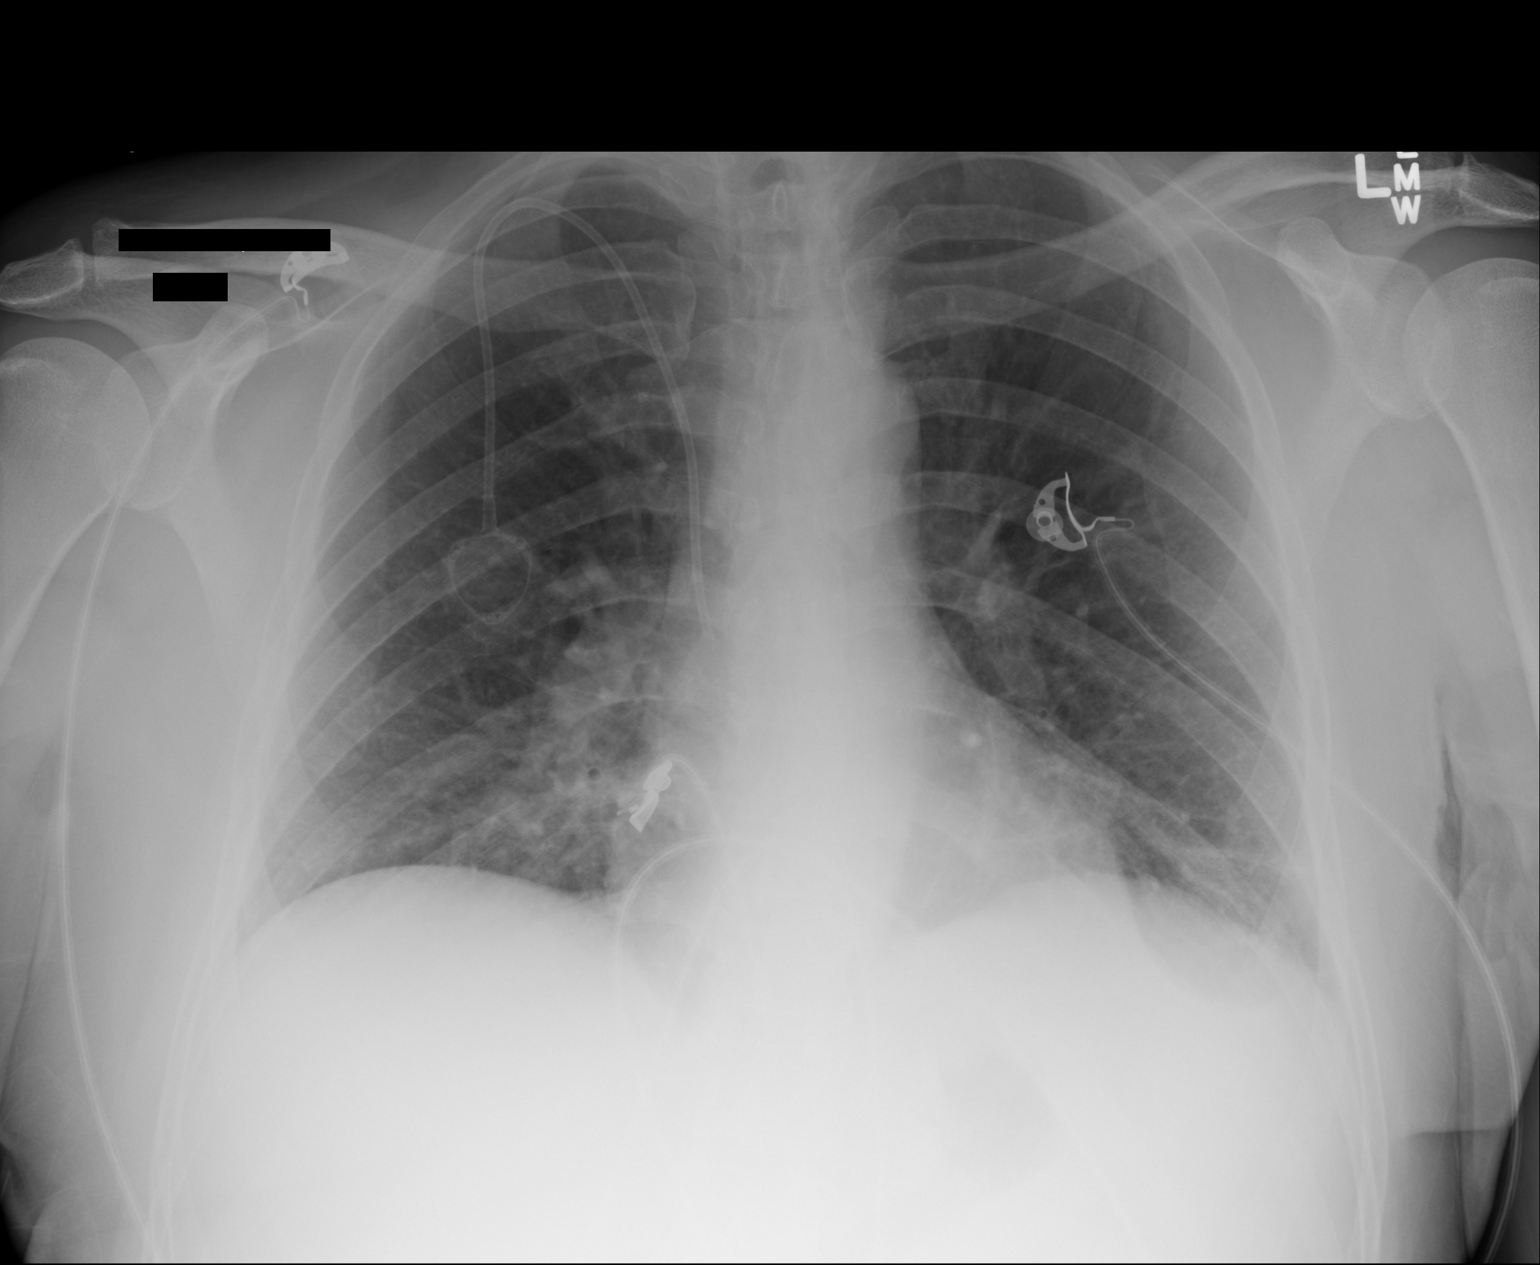

[1 of 1 positions shown; findings below may reference images not displayed]

FINDINGS: Some increase in subsegmental atelectasis personally infiltrates in
the lung bases. Interval placement of right IJ power injectable port
catheter, tip distal SVC. No pneumothorax. Heart size normal.
No effusion.
Visualized skeletal structures are unremarkable.
IMPRESSION: 1. Port placement to distal SVC without pneumothorax.
2. Some increase in bibasilar subsegmental atelectasis versus early
infiltrates.

## 2017-01-23 DEATH — deceased
